# Patient Record
Sex: Male | Born: 1995 | Race: Black or African American | Hispanic: No | Marital: Single | Smoking: Never smoker
Health system: Southern US, Community
[De-identification: ages and names within clinical notes are randomized; demographics above are authoritative.]

---
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
## 2016-07-22 ENCOUNTER — Emergency Department (HOSPITAL_COMMUNITY)
Admission: EM | Admit: 2016-07-22 | Discharge: 2016-07-22 | Disposition: A | Discharge status: Home / Self Care | Payer: 59 | Attending: Emergency Medicine | Admitting: Emergency Medicine

## 2016-07-22 ENCOUNTER — Emergency Department (HOSPITAL_COMMUNITY): Payer: 59

## 2016-07-22 ENCOUNTER — Encounter (HOSPITAL_COMMUNITY): Payer: Self-pay | Admitting: Emergency Medicine

## 2016-07-22 DIAGNOSIS — R109 Unspecified abdominal pain: Secondary | ICD-10-CM | POA: Diagnosis present

## 2016-07-22 DIAGNOSIS — R1032 Left lower quadrant pain: Secondary | ICD-10-CM

## 2016-07-22 DIAGNOSIS — K5900 Constipation, unspecified: Secondary | ICD-10-CM

## 2016-07-22 DIAGNOSIS — J45909 Unspecified asthma, uncomplicated: Secondary | ICD-10-CM | POA: Insufficient documentation

## 2016-07-22 LAB — CBC
HEMATOCRIT: 39.5 % (ref 39.0–52.0)
Hemoglobin: 13.5 g/dL (ref 13.0–17.0)
MCH: 28.8 pg (ref 26.0–34.0)
MCHC: 34.2 g/dL (ref 30.0–36.0)
MCV: 84.2 fL (ref 78.0–100.0)
Platelets: 194 10*3/uL (ref 150–400)
RBC: 4.69 MIL/uL (ref 4.22–5.81)
RDW: 13.3 % (ref 11.5–15.5)
WBC: 10.2 10*3/uL (ref 4.0–10.5)

## 2016-07-22 LAB — COMPREHENSIVE METABOLIC PANEL
ALK PHOS: 87 U/L (ref 38–126)
ALT: 18 U/L (ref 17–63)
ANION GAP: 10 (ref 5–15)
AST: 25 U/L (ref 15–41)
Albumin: 4.9 g/dL (ref 3.5–5.0)
BILIRUBIN TOTAL: 0.6 mg/dL (ref 0.3–1.2)
BUN: 7 mg/dL (ref 6–20)
CALCIUM: 10.1 mg/dL (ref 8.9–10.3)
CO2: 25 mmol/L (ref 22–32)
Chloride: 105 mmol/L (ref 101–111)
Creatinine, Ser: 0.9 mg/dL (ref 0.61–1.24)
GFR calc Af Amer: >60 mL/min (ref >60)
Glucose, Bld: 131 mg/dL — ABNORMAL HIGH (ref 65–99)
Potassium: 3.2 mmol/L — ABNORMAL LOW (ref 3.5–5.1)
Sodium: 140 mmol/L (ref 135–145)
TOTAL PROTEIN: 8.1 g/dL (ref 6.5–8.1)

## 2016-07-22 LAB — URINALYSIS, ROUTINE W REFLEX MICROSCOPIC
BILIRUBIN URINE: NEGATIVE
Glucose, UA: NEGATIVE mg/dL
Hgb urine dipstick: NEGATIVE
KETONES UR: NEGATIVE mg/dL
LEUKOCYTES UA: NEGATIVE
NITRITE: NEGATIVE
PROTEIN: NEGATIVE mg/dL
Specific Gravity, Urine: 1.014 (ref 1.005–1.030)
pH: 9 — ABNORMAL HIGH (ref 5.0–8.0)

## 2016-07-22 LAB — LIPASE, BLOOD: Lipase: 44 U/L (ref 11–51)

## 2016-07-22 MED ORDER — SODIUM CHLORIDE 0.9 % IV BOLUS (SEPSIS)
1000.0000 mL | Freq: Once | INTRAVENOUS | Status: AC
  Administered 2016-07-22: 1000 mL via INTRAVENOUS

## 2016-07-22 MED ORDER — ONDANSETRON 8 MG PO TBDP: 8.0000 mg | ORAL_TABLET | Freq: Three times a day (TID) | ORAL | 0 refills | Status: DC | PRN

## 2016-07-22 MED ORDER — ONDANSETRON HCL 4 MG/2ML IJ SOLN
4.0000 mg | Freq: Once | INTRAMUSCULAR | Status: AC
  Administered 2016-07-22: 4 mg via INTRAVENOUS
  Filled 2016-07-22: qty 2

## 2016-07-22 MED ORDER — MAGNESIUM CITRATE PO SOLN
1.0000 | Freq: Once | ORAL | Status: AC
  Administered 2016-07-22: 1 via ORAL
  Filled 2016-07-22: qty 296

## 2016-07-22 MED ORDER — PROMETHAZINE HCL 25 MG/ML IJ SOLN
12.5000 mg | Freq: Once | INTRAMUSCULAR | Status: DC
  Filled 2016-07-22: qty 1

## 2016-07-22 MED ORDER — MORPHINE SULFATE (PF) 4 MG/ML IV SOLN
4.0000 mg | Freq: Once | INTRAVENOUS | Status: AC
  Administered 2016-07-22: 4 mg via INTRAVENOUS
  Filled 2016-07-22: qty 1

## 2016-07-22 NOTE — ED Provider Notes (Signed)
[facility]-EMERGENCY DEPT Provider Note   CSN: [id number] Arrival date & time: 07/22/16  0548     History   Chief Complaint Chief Complaint  Patient presents with  . Abdominal Pain    HPI Mark SettersChristopher Lamb is a 21 y.o. male.  HPI Mark SettersChristopher Danker is a 21 y.o. male presents to emergency department with sudden onset of severe left-sided abdominal pain. Pain onset several hours ago. Pain is sharp, shooting in the left lower quadrant. Reports between the sharp episodes, having dull pain. Reports associated nausea or vomiting. No diarrhea. No urinary symptoms. No fever or chills. No blood in his stool or emesis. No history of similar pain in the past. No treatment prior to coming in. Nothing is making his symptoms better or worse.  Past Medical History:  Diagnosis Date  . Asthma     There are no active problems to display for this patient.   Past Surgical History:  Procedure Laterality Date  . PATELLAR TENDON REPAIR  05/13/2011   Procedure: PATELLA TENDON REPAIR;  Surgeon: Loanne DrillingFrank V Aluisio;  Location: [facility];  Service: Orthopedics;  Laterality: Left;  LEFT TIBIAL TUBICAL FIXATION       Home Medications    Prior to Admission medications   Medication Sig Start Date End Date Taking? Authorizing Provider  ibuprofen (ADVIL,MOTRIN) 600 MG tablet Take 1 tablet (600 mg total) by mouth every 6 (six) hours as needed for pain. Patient not taking: Reported on 07/22/2016 04/08/12   Fayrene HelperBowie Tran, PA-C    Family History History reviewed. No pertinent family history.  Social History Social History  Substance Use Topics  . Smoking status: Never Smoker  . Smokeless tobacco: Never Used  . Alcohol use No     Allergies   Patient has no known allergies.   Review of Systems Review of Systems  Constitutional: Negative for chills and fever.  Respiratory: Negative for cough, chest tightness and shortness of breath.   Cardiovascular: Negative for chest pain, palpitations and leg swelling.    Gastrointestinal: Positive for abdominal pain, nausea and vomiting. Negative for abdominal distention and diarrhea.  Genitourinary: Negative for dysuria, frequency, hematuria and urgency.  Musculoskeletal: Negative for arthralgias, myalgias, neck pain and neck stiffness.  Skin: Negative for rash.  Allergic/Immunologic: Negative for immunocompromised state.  Neurological: Negative for dizziness, weakness, light-headedness, numbness and headaches.  All other systems reviewed and are negative.    Physical Exam Updated Vital Signs BP 123/78 (BP Location: Right Arm)   Pulse 82   Temp 97.3 F (36.3 C) (Oral)   Resp 20   Ht 6\' 1"  (1.854 m)   Wt 64.4 kg   SpO2 100%   BMI 18.73 kg/m   Physical Exam  Constitutional: He appears well-developed and well-nourished. No distress.  HENT:  Head: Normocephalic and atraumatic.  Eyes: Conjunctivae are normal.  Neck: Neck supple.  Cardiovascular: Normal rate, regular rhythm and normal heart sounds.   Pulmonary/Chest: Effort normal. No respiratory distress. He has no wheezes. He has no rales.  Abdominal: Soft. Bowel sounds are normal. He exhibits no distension. There is tenderness. There is no rebound.  LLQ tenderness. No CVA tenderness  Musculoskeletal: He exhibits no edema.  Neurological: He is alert.  Skin: Skin is warm and dry.  Nursing note and vitals reviewed.    ED Treatments / Results  Labs (all labs ordered are listed, but only abnormal results are displayed) Labs Reviewed  COMPREHENSIVE METABOLIC PANEL - Abnormal; Notable for the following:  Result Value   Potassium 3.2 (*)    Glucose, Bld 131 (*)    All other components within normal limits  URINALYSIS, ROUTINE W REFLEX MICROSCOPIC - Abnormal; Notable for the following:    APPearance CLOUDY (*)    pH 9.0 (*)    All other components within normal limits  LIPASE, BLOOD  CBC    EKG  EKG Interpretation None       Radiology Ct Renal Stone Study  Result Date:  07/22/2016 CLINICAL DATA:  Left-sided pain EXAM: CT ABDOMEN AND PELVIS WITHOUT CONTRAST TECHNIQUE: Multidetector CT imaging of the abdomen and pelvis was performed following the standard protocol without IV contrast. COMPARISON:  None. FINDINGS: Lower chest: Negative Hepatobiliary: Negative Pancreas: Negative Spleen: Negative Adrenals/Urinary Tract: Negative for renal calculi. No renal obstruction or mass. Mild thickening of the bladder wall could represent cystitis Stomach/Bowel: Normal stomach. No bowel obstruction. Moderate stool throughout the colon. Normal appendix. Vascular/Lymphatic: Negative Reproductive: Negative Other: No free fluid.  Negative for hernia. Musculoskeletal: Negative IMPRESSION: Moderate constipation Mild bladder wall thickening Negative for renal calculi Electronically Signed   By: Marlan Palau M.D.   On: 07/22/2016 08:44    Procedures Procedures (including critical care time)  Medications Ordered in ED Medications  ondansetron (ZOFRAN) injection 4 mg (4 mg Intravenous Given 07/22/16 0733)  morphine 4 MG/ML injection 4 mg (4 mg Intravenous Given 07/22/16 0733)  sodium chloride 0.9 % bolus 1,000 mL (0 mLs Intravenous Stopped 07/22/16 0825)  magnesium citrate solution 1 Bottle (1 Bottle Oral Given 07/22/16 0934)     Initial Impression / Assessment and Plan / ED Course  I have reviewed the triage vital signs and the nursing notes.  Pertinent labs & imaging results that were available during my care of the patient were reviewed by me and considered in my medical decision making (see chart for details).    Pt's symptoms of dull left sided pain with intermittent sharp pain, concerning for a kidney stone. Pt is otherwise healthy with no medical or prior abdominal issues. Labs pending. Will get ct non contrast for further evaluation.   UA cloudy but no signs of infection. Labs showing potassium of 3.2, glu of 131, WBC normal. Renal stone study obtained and is negative. Pt has  some bladder wall thickening but no signs of UTI. Normal appendix. Moderate constipation on CT, will treat with miralax and mag citrate. Patient feels better at this time. He is in no distress. He is laughing, no more vomiting. Home with follow up as needed. Return precautions discussed.   Vitals:   07/22/16 0559 07/22/16 0640 07/22/16 0700 07/22/16 0730  BP: 123/78  114/67 121/68  Pulse: 82  76 78  Resp: 20  23 10   Temp: 97.3 F (36.3 C)     TempSrc: Oral     SpO2: 100%  93% 99%  Weight:  64.4 kg    Height:  6\' 1"  (1.854 m)       Final Clinical Impressions(s) / ED Diagnoses   Final diagnoses:  Left lower quadrant pain  Constipation, unspecified constipation type    New Prescriptions Discharge Medication List as of 07/22/2016  9:21 AM    START taking these medications   Details  ondansetron (ZOFRAN ODT) 8 MG disintegrating tablet Take 1 tablet (8 mg total) by mouth every 8 (eight) hours as needed for nausea or vomiting., Starting Thu 07/22/2016, Print         [employer], PA-C 07/22/16 1453  Jacalyn Lefevre, MD 07/29/16 1330

## 2016-07-22 NOTE — ED Triage Notes (Signed)
Pt states he woke with pain in his left lower quadrant around 4am  Pt states he has had nausea and vomiting ever since  Pt states last bowel movement was yesterday

## 2016-07-22 NOTE — ED Notes (Signed)
Pt has active vomiting in triage

## 2016-07-22 NOTE — Discharge Instructions (Signed)
Take magnesium citrate as prescribed until all gone. You can try miralax daily if continue to have constipation problems. Drink plenty of fluids. Take Zofran as prescribed as needed for any nausea. Tylenol for pain. Please follow-up with the family doctor or return if not improving.

## 2016-11-16 ENCOUNTER — Other Ambulatory Visit: Payer: Self-pay | Admitting: *Deleted

## 2016-11-16 ENCOUNTER — Encounter: Payer: Self-pay | Admitting: Neurology

## 2016-11-16 DIAGNOSIS — R202 Paresthesia of skin: Secondary | ICD-10-CM

## 2016-11-30 ENCOUNTER — Encounter: Payer: 59 | Admitting: Neurology

## 2017-03-31 ENCOUNTER — Emergency Department (HOSPITAL_COMMUNITY)
Admission: EM | Admit: 2017-03-31 | Discharge: 2017-03-31 | Disposition: A | Discharge status: Home / Self Care | Payer: 59 | Attending: Emergency Medicine | Admitting: Emergency Medicine

## 2017-03-31 ENCOUNTER — Emergency Department (HOSPITAL_COMMUNITY): Payer: 59

## 2017-03-31 ENCOUNTER — Encounter (HOSPITAL_COMMUNITY): Payer: Self-pay | Admitting: Emergency Medicine

## 2017-03-31 DIAGNOSIS — Y929 Unspecified place or not applicable: Secondary | ICD-10-CM | POA: Diagnosis not present

## 2017-03-31 DIAGNOSIS — S99921A Unspecified injury of right foot, initial encounter: Secondary | ICD-10-CM | POA: Diagnosis present

## 2017-03-31 DIAGNOSIS — Y9389 Activity, other specified: Secondary | ICD-10-CM | POA: Insufficient documentation

## 2017-03-31 DIAGNOSIS — S92426A Nondisplaced fracture of distal phalanx of unspecified great toe, initial encounter for closed fracture: Secondary | ICD-10-CM | POA: Diagnosis not present

## 2017-03-31 DIAGNOSIS — W230XXA Caught, crushed, jammed, or pinched between moving objects, initial encounter: Secondary | ICD-10-CM | POA: Diagnosis not present

## 2017-03-31 DIAGNOSIS — J45909 Unspecified asthma, uncomplicated: Secondary | ICD-10-CM | POA: Diagnosis not present

## 2017-03-31 DIAGNOSIS — Y998 Other external cause status: Secondary | ICD-10-CM | POA: Diagnosis not present

## 2017-03-31 MED ORDER — HYDROCODONE-ACETAMINOPHEN 5-325 MG PO TABS
2.0000 | ORAL_TABLET | Freq: Once | ORAL | Status: AC
Start: 2017-03-31 — End: 2017-03-31
  Administered 2017-03-31: 2 via ORAL
  Filled 2017-03-31: qty 2

## 2017-03-31 MED ORDER — IBUPROFEN 800 MG PO TABS: 800.0000 mg | ORAL_TABLET | Freq: Three times a day (TID) | ORAL | 0 refills | Status: DC

## 2017-03-31 NOTE — ED Triage Notes (Signed)
Pt c/o right great toe pain after dropping a dresser on his foot tonight.

## 2017-03-31 NOTE — ED Provider Notes (Signed)
[facility] Provider Note   CSN: [id number] Arrival date & time: 03/31/17  0147     History   Chief Complaint Chief Complaint  Patient presents with  . Toe Pain    HPI Mark Lamb is a 21 y.o. male.  Patient presents to the emergency department with a chief complaint of right great toe pain.  He states that he was helping a friend move chest of drawers, and dropped the dresser on his foot.  He reports pain at the site.  He took Advil and went to bed.  States that when he got up to use the bathroom, he noticed that it was swollen and more painful.  He reports increased pain with movement and palpation.  He denies any other associated symptoms.   The history is provided by the patient. No language interpreter was used.    Past Medical History:  Diagnosis Date  . Asthma     There are no active problems to display for this patient.   Past Surgical History:  Procedure Laterality Date  . PATELLAR TENDON REPAIR  05/13/2011   Procedure: PATELLA TENDON REPAIR;  Surgeon: Loanne DrillingFrank V Aluisio;  Location: [facility];  Service: Orthopedics;  Laterality: Left;  LEFT TIBIAL TUBICAL FIXATION       Home Medications    Prior to Admission medications   Medication Sig Start Date End Date Taking? Authorizing Provider  ibuprofen (ADVIL,MOTRIN) 600 MG tablet Take 1 tablet (600 mg total) by mouth every 6 (six) hours as needed for pain. Patient not taking: Reported on 07/22/2016 04/08/12   Fayrene Helperran, Bowie, PA-C  ondansetron (ZOFRAN ODT) 8 MG disintegrating tablet Take 1 tablet (8 mg total) by mouth every 8 (eight) hours as needed for nausea or vomiting. 07/22/16   Jaynie CrumbleKirichenko, Tatyana, PA-C    Family History No family history on file.  Social History Social History  Substance Use Topics  . Smoking status: Never Smoker  . Smokeless tobacco: Never Used  . Alcohol use No     Allergies   Patient has no known allergies.   Review of Systems Review of  Systems  All other systems reviewed and are negative.    Physical Exam Updated Vital Signs BP (!) 129/92 (BP Location: Left Arm)   Pulse 72   Temp 98.3 F (36.8 C) (Oral)   Resp 14   Ht 6\' 1"  (1.854 m)   Wt 59.4 kg (131 lb)   SpO2 98%   BMI 17.28 kg/m   Physical Exam Nursing note and vitals reviewed.  Constitutional: Pt appears well-developed and well-nourished. No distress.  HENT:  Head: Normocephalic and atraumatic.  Eyes: Conjunctivae are normal.  Neck: Normal range of motion.  Cardiovascular: Normal rate, regular rhythm. Intact distal pulses.   Capillary refill < 3 sec.  Pulmonary/Chest: Effort normal and breath sounds normal.  Musculoskeletal:  Right great toe Pt exhibits TTP of the right great toe.   ROM: 5/5  Strength: 5/5  Neurological: Pt  is alert. Coordination normal.  Sensation: 5/5 Skin: Skin is warm and dry. Pt is not diaphoretic.  No evidence of open wound or skin tenting Psychiatric: Pt has a normal mood and affect.     ED Treatments / Results  Labs (all labs ordered are listed, but only abnormal results are displayed) Labs Reviewed - No data to display  EKG  EKG Interpretation None       Radiology Dg Foot Complete Right  Result Date: 03/31/2017 CLINICAL DATA:  Acute onset of right great toe and second toe pain after dropping dresser on foot. Initial encounter. EXAM: RIGHT FOOT COMPLETE - 3+ VIEW COMPARISON:  Right ankle radiographs performed 04/08/2012 FINDINGS: There is a minimally displaced fracture at the lateral aspect of the base of the first distal phalanx, with intra-articular extension and associated soft tissue swelling. There is no evidence of talar subluxation; the subtalar joint is unremarkable in appearance. No significant soft tissue abnormalities are seen. IMPRESSION: Minimally displaced fracture at the lateral aspect of the base of the first distal phalanx. Electronically Signed   By: Roanna Raider M.D.   On: 03/31/2017 02:49     Procedures Procedures (including critical care time)  Medications Ordered in ED Medications  HYDROcodone-acetaminophen (NORCO/VICODIN) 5-325 MG per tablet 2 tablet (not administered)     Initial Impression / Assessment and Plan / ED Course  I have reviewed the triage vital signs and the nursing notes.  Pertinent labs & imaging results that were available during my care of the patient were reviewed by me and considered in my medical decision making (see chart for details).     Patient X-Ray negative for obvious fracture or dislocation.  Pt advised to follow up with orthopedics. Patient given post op shoe while in ED, conservative therapy recommended and discussed. Patient will be discharged home & is agreeable with above plan. Returns precautions discussed. Pt appears safe for discharge.   Final Clinical Impressions(s) / ED Diagnoses   Final diagnoses:  Closed nondisplaced fracture of distal phalanx of great toe, unspecified laterality, initial encounter    New Prescriptions New Prescriptions   IBUPROFEN (ADVIL,MOTRIN) 800 MG TABLET    Take 1 tablet (800 mg total) by mouth 3 (three) times daily.     Roxy Horseman, PA-C 03/31/17 0328    Ward, Layla Maw, DO 03/31/17 [phone]

## 2017-09-14 ENCOUNTER — Emergency Department (HOSPITAL_COMMUNITY): Payer: 59

## 2017-09-14 ENCOUNTER — Emergency Department (HOSPITAL_COMMUNITY)
Admission: EM | Admit: 2017-09-14 | Discharge: 2017-09-14 | Disposition: A | Discharge status: Home / Self Care | Payer: 59 | Attending: Emergency Medicine | Admitting: Emergency Medicine

## 2017-09-14 ENCOUNTER — Encounter (HOSPITAL_COMMUNITY): Payer: Self-pay

## 2017-09-14 DIAGNOSIS — Y9301 Activity, walking, marching and hiking: Secondary | ICD-10-CM | POA: Insufficient documentation

## 2017-09-14 DIAGNOSIS — S92025A Nondisplaced fracture of anterior process of left calcaneus, initial encounter for closed fracture: Secondary | ICD-10-CM | POA: Insufficient documentation

## 2017-09-14 DIAGNOSIS — J45909 Unspecified asthma, uncomplicated: Secondary | ICD-10-CM | POA: Diagnosis not present

## 2017-09-14 DIAGNOSIS — Y929 Unspecified place or not applicable: Secondary | ICD-10-CM | POA: Insufficient documentation

## 2017-09-14 DIAGNOSIS — S99922A Unspecified injury of left foot, initial encounter: Secondary | ICD-10-CM | POA: Diagnosis present

## 2017-09-14 DIAGNOSIS — Y999 Unspecified external cause status: Secondary | ICD-10-CM | POA: Insufficient documentation

## 2017-09-14 DIAGNOSIS — W0110XA Fall on same level from slipping, tripping and stumbling with subsequent striking against unspecified object, initial encounter: Secondary | ICD-10-CM | POA: Diagnosis not present

## 2017-09-14 MED ORDER — HYDROCODONE-ACETAMINOPHEN 5-325 MG PO TABS
2.0000 | ORAL_TABLET | Freq: Once | ORAL | Status: AC
  Administered 2017-09-14: 2 via ORAL
  Filled 2017-09-14: qty 2

## 2017-09-14 MED ORDER — HYDROCODONE-ACETAMINOPHEN 5-325 MG PO TABS: 1.0000 | ORAL_TABLET | Freq: Four times a day (QID) | ORAL | 0 refills | Status: DC | PRN

## 2017-09-14 NOTE — ED Triage Notes (Signed)
Pt was running and fell into a hole, he complains of left foot pain, the top of his foot is swollen

## 2017-09-14 NOTE — Discharge Instructions (Signed)
Your x-ray today is concerning for a calcaneal fracture.  He has been placed in a splint which you should wear at all times.  Do not remove this.  Do not put any weight on your left foot.  You have been given crutches to assist you when you are mobile.  Follow-up with orthopedics to ensure proper healing of your broken bone.  You may take Norco as prescribed for pain control.  Ice over top of your splint 3-4 times per day for 15-20 minutes each time.  Also keep your foot elevated as much as possible.  You may return to the emergency department for new or concerning symptoms such as severe worsening of your pain, paleness to your toes, inability to feel your toes.

## 2017-09-14 NOTE — ED Provider Notes (Signed)
[facility]-EMERGENCY DEPT Provider Note   CSN: [id number] Arrival date & time: 09/14/17  0201     History   Chief Complaint Chief Complaint  Patient presents with  . Foot Pain    HPI [name] is a 22 y.o. male.   22 year old male presents to the emergency department for evaluation of left foot pain which has been gradually worsening since accidentally stepping into a hole tonight.  He took 2 Aleve prior to arrival without improvement in his pain.  Pain is aggravated with weightbearing.  He did notice some temporary improvement with a warm Epsom salt soaks.  He has continued to experience swelling to the lateral aspect of his foot.  He feels paresthesias to his distal lower extremity.  No numbness to toes or associated pallor.  No prior injury to the left foot.     Past Medical History:  Diagnosis Date  . Asthma     There are no active problems to display for this patient.   Past Surgical History:  Procedure Laterality Date  . PATELLAR TENDON REPAIR  05/13/2011   Procedure: PATELLA TENDON REPAIR;  Surgeon: Loanne DrillingFrank V Aluisio;  Location: [facility];  Service: Orthopedics;  Laterality: Left;  LEFT TIBIAL TUBICAL FIXATION        Home Medications    Prior to Admission medications   Medication Sig Start Date End Date Taking? Authorizing Provider  naproxen sodium (ALEVE) 220 MG tablet Take 440 mg by mouth 2 (two) times daily as needed (pain).   Yes [provider]  HYDROcodone-acetaminophen (NORCO/VICODIN) 5-325 MG tablet Take 1-2 tablets by mouth every 6 (six) hours as needed for moderate pain or severe pain. 09/14/17   Antony MaduraHumes, Araya Roel, PA-C  ibuprofen (ADVIL,MOTRIN) 800 MG tablet Take 1 tablet (800 mg total) by mouth 3 (three) times daily. Patient not taking: Reported on 09/14/2017 03/31/17   Roxy HorsemanBrowning, Robert, PA-C  ondansetron (ZOFRAN ODT) 8 MG disintegrating tablet Take 1 tablet (8 mg total) by mouth every 8 (eight) hours as needed for nausea  or vomiting. Patient not taking: Reported on 09/14/2017 07/22/16   Jaynie CrumbleKirichenko, Tatyana, PA-C    Family History History reviewed. No pertinent family history.  Social History Social History   Tobacco Use  . Smoking status: Never Smoker  . Smokeless tobacco: Never Used  Substance Use Topics  . Alcohol use: No  . Drug use: No     Allergies   Patient has no known allergies.   Review of Systems Review of Systems Ten systems reviewed and are negative for acute change, except as noted in the HPI.    Physical Exam Updated Vital Signs There were no vitals taken for this visit.  Physical Exam  Constitutional: He is oriented to person, place, and time. He appears well-developed and well-nourished. No distress.  Nontoxic appearing and in no distress  HENT:  Head: Normocephalic and atraumatic.  Eyes: Conjunctivae and EOM are normal. No scleral icterus.  Neck: Normal range of motion.  Cardiovascular: Normal rate, regular rhythm and intact distal pulses.  DP pulse and PT pulse 2+ in the LLE. Capillary refill brisk in all digits of the L foot.  Pulmonary/Chest: Effort normal. No respiratory distress.  Respirations even and unlabored  Musculoskeletal:       Left foot: There is decreased range of motion (2/2 pain), tenderness and swelling. There is no deformity.       Feet:  Neurological: He is alert and oriented to person, place, and time. He exhibits  normal muscle tone. Coordination normal.  Sensation to light touch intact.  Patient able to wiggle all toes.  Skin: Skin is warm and dry. No rash noted. He is not diaphoretic. No erythema. No pallor.  Psychiatric: He has a normal mood and affect. His behavior is normal.  Nursing note and vitals reviewed.    ED Treatments / Results  Labs (all labs ordered are listed, but only abnormal results are displayed) Labs Reviewed - No data to display  EKG None  Radiology Dg Foot Complete Left  Result Date: 09/14/2017 CLINICAL DATA:   Left foot pain after fall after stepping in a hole. EXAM: LEFT FOOT - COMPLETE 3+ VIEW COMPARISON:  None. FINDINGS: Cortical irregularity of the anterolateral calcaneus suspicious for acute fracture. No additional fracture of the foot. The alignment and joint spaces are maintained. Slight lateral soft tissue edema. IMPRESSION: Cortical irregularity of the anterolateral calcaneus suspicious for acute fracture. Electronically Signed   By: Rubye Oaks M.D.   On: 09/14/2017 03:53    Procedures Procedures (including critical care time)  Medications Ordered in ED Medications  HYDROcodone-acetaminophen (NORCO/VICODIN) 5-325 MG per tablet 2 tablet (2 tablets Oral Given 09/14/17 0426)     Initial Impression / Assessment and Plan / ED Course  I have reviewed the triage vital signs and the nursing notes.  Pertinent labs & imaging results that were available during my care of the patient were reviewed by me and considered in my medical decision making (see chart for details).     22 year old male presenting to the emergency department for evaluation of left foot pain after falling in a hole.  He has concern for possible anterior calcaneus fracture.  Patient neurovascularly intact without signs of compartment syndrome.  He has been given Norco for pain management and placed in a bulky Jones splint for stability.  Crutches given with strict instructions for patient to remain nonweightbearing.  He has been given a referral to orthopedics for outpatient follow-up.  Return precautions discussed and provided. Patient discharged in stable condition with no unaddressed concerns.   Final Clinical Impressions(s) / ED Diagnoses   Final diagnoses:  Closed nondisplaced fracture of anterior process of left calcaneus, initial encounter    ED Discharge Orders        Ordered    HYDROcodone-acetaminophen (NORCO/VICODIN) 5-325 MG tablet  Every 6 hours PRN     09/14/17 0508       Antony Madura, PA-C 09/14/17  0522    Molpus, Jonny Ruiz, MD 09/14/17 [phone]

## 2017-09-22 ENCOUNTER — Encounter (INDEPENDENT_AMBULATORY_CARE_PROVIDER_SITE_OTHER): Payer: Self-pay | Admitting: Surgery

## 2017-09-22 ENCOUNTER — Ambulatory Visit (INDEPENDENT_AMBULATORY_CARE_PROVIDER_SITE_OTHER): Payer: 59 | Admitting: Surgery

## 2017-09-22 VITALS — Ht 73.0 in | Wt 131.0 lb

## 2017-09-22 DIAGNOSIS — S92002A Unspecified fracture of left calcaneus, initial encounter for closed fracture: Secondary | ICD-10-CM | POA: Diagnosis not present

## 2017-09-22 DIAGNOSIS — S93492A Sprain of other ligament of left ankle, initial encounter: Secondary | ICD-10-CM | POA: Diagnosis not present

## 2017-09-22 NOTE — Progress Notes (Signed)
Office Visit Note   Patient: Mark Lamb           Date of Birth: 07-Sep-1995           MRN: [id number] Visit Date: 09/22/2017              Requested by: No referring provider defined for this encounter. PCP: Patient, No Pcp Per   Assessment & Plan: Visit Diagnoses:  1. Closed nondisplaced fracture of left calcaneus, unspecified portion of calcaneus, initial encounter   2. Sprain of anterior talofibular ligament of left ankle, initial encounter     Plan: Today patient was put in a Cam boot.  We will continue nonweightbearing left foot until he sees Dr. Ophelia Charter in 1 week.  Ice and elevate as needed.  Recommend that he be out of work at least until follow-up visit.  Patient was wanting to return back to work but I advised him that this is going to be extremely difficult considering that he will likely be nonweightbearing for at least 6 weeks.  We will see if Dr. Ophelia Charter wants to change the plan next week when he is seen.  Follow-Up Instructions: Return in about 1 week (around 09/29/2017) for WITH DR YATES.   Orders:  No orders of the defined types were placed in this encounter.  No orders of the defined types were placed in this encounter.     Procedures: No procedures performed   Clinical Data: No additional findings.   Subjective: Chief Complaint  Patient presents with  . Right Foot - Follow-up, Pain    DOI 09/14/17 stepped in a hole - went to [facility] ED    HPI 22 year old black male who is new patient to the clinic comes in with left heel and lateral ankle pain.  States that September 14, 2017 he was running at full speed with his friends when his foot went into a hole and he suffered an inversion injury of the left ankle.  He went to the emergency room and had x-rays.  These showed:  EXAM: LEFT FOOT - COMPLETE 3+ VIEW  COMPARISON:  None.  FINDINGS: Cortical irregularity of the anterolateral calcaneus suspicious for acute fracture. No additional fracture of the foot.  The alignment and joint spaces are maintained. Slight lateral soft tissue edema.  IMPRESSION: Cortical irregularity of the anterolateral calcaneus suspicious for acute fracture.  Patient was put in a splint and has been nonweightbearing with crutches.   Review of Systems No current cardiac pulmonary GI GU issues  Objective: Vital Signs: Ht 6\' 1"  (1.854 m)   Wt 131 lb (59.4 kg)   BMI 17.28 kg/m   Physical Exam  Constitutional: He is oriented to person, place, and time. No distress.  HENT:  Head: Normocephalic and atraumatic.  Eyes: Pupils are equal, round, and reactive to light. EOM are normal.  Neck: Normal range of motion.  Pulmonary/Chest: No respiratory distress.  Musculoskeletal:  Left ankle good range of motion.  Ligaments stable.  He is mild to moderately tender over the ATFL.  He is tender over the lateral calcaneus over the fracture site.  Neuro vas intact.  Neurological: He is alert and oriented to person, place, and time.  Psychiatric: He has a normal mood and affect.    Ortho Exam  Specialty Comments:  No specialty comments available.  Imaging: No results found.   PMFS History: There are no active problems to display for this patient.  Past Medical History:  Diagnosis Date  . Asthma  History reviewed. No pertinent family history.  Past Surgical History:  Procedure Laterality Date  . PATELLAR TENDON REPAIR  05/13/2011   Procedure: PATELLA TENDON REPAIR;  Surgeon: Loanne DrillingFrank V Aluisio;  Location: [facility];  Service: Orthopedics;  Laterality: Left;  LEFT TIBIAL TUBICAL FIXATION   Social History   Occupational History  . Not on file  Tobacco Use  . Smoking status: Never Smoker  . Smokeless tobacco: Never Used  Substance and Sexual Activity  . Alcohol use: No  . Drug use: No  . Sexual activity: Never

## 2017-10-04 ENCOUNTER — Encounter (INDEPENDENT_AMBULATORY_CARE_PROVIDER_SITE_OTHER): Payer: Self-pay | Admitting: Orthopaedic Surgery

## 2017-10-04 ENCOUNTER — Ambulatory Visit (INDEPENDENT_AMBULATORY_CARE_PROVIDER_SITE_OTHER): Payer: 59 | Admitting: Orthopaedic Surgery

## 2017-10-04 ENCOUNTER — Ambulatory Visit (INDEPENDENT_AMBULATORY_CARE_PROVIDER_SITE_OTHER): Payer: 59

## 2017-10-04 VITALS — BP 129/64 | HR 77 | Ht 73.0 in | Wt 131.0 lb

## 2017-10-04 DIAGNOSIS — S92002A Unspecified fracture of left calcaneus, initial encounter for closed fracture: Secondary | ICD-10-CM

## 2017-10-04 NOTE — Progress Notes (Signed)
   Office Visit Note   Patient: Mark Lamb           Date of Birth: [birth year]           MRN: [id number] Visit Date: 10/04/2017              Requested by: No referring provider defined for this encounter. PCP: Patient, No Pcp Per   Assessment & Plan: Visit Diagnoses:  1. Closed nondisplaced fracture of left calcaneus, unspecified portion of calcaneus, initial encounter     Plan: patient ok to start weightbearing as tolerated in his boot. Follow up with Dr Ophelia Charter in 2 weeks for recheck.  No running or jumping.  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  Follow-Up Instructions: Return in about 2 weeks (around 10/18/2017) for with Dr Ophelia Charter .   Orders:  Orders Placed This Encounter  Procedures  . XR Foot Complete Left   No orders of the defined types were placed in this encounter.     Procedures: No procedures performed   Clinical Data: No additional findings.   Subjective: Chief Complaint  Patient presents with  . Left Foot - Fracture, Follow-up    HPI 22 year old black male returns for recheck.  He does still continue to have foot pain attempted weightbearing.  I had instructed him to be nonweightbearing last office visit.  Patient comes in today for recheck to see if he is better for Dr. Ophelia Charter to release him back to work.  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 Review of Systems No current cardiac pulmonary GI GU issues  Objective: Vital Signs: BP 129/64   Pulse 77   Ht  (1.854 m)   Wt 131 lb (59.4 kg)   BMI 17.28 kg/m   Physical Exam  Constitutional: He is oriented to person, place, and time. No distress.  HENT:  Head: Normocephalic and atraumatic.  Eyes: Pupils are equal, round, and reactive to light.    Pulmonary/Chest: No respiratory distress.  Musculoskeletal:  No foot swelling.  He still continues to be tender over the avulsion fracture site.  Neurovascular intact.  Seen with Dr. Ophelia Charter today.  Neurological: He is alert and oriented to person, place, and time.    Ortho Exam  Specialty Comments:  No specialty comments available.  Imaging: No results found.   PMFS History: There are no active problems to display for this patient.  Past Medical History:  Diagnosis Date  . Asthma     No family history on file.  Past Surgical History:  Procedure Laterality Date  . PATELLAR TENDON REPAIR  05/13/2011   Procedure: PATELLA TENDON REPAIR;  Surgeon: Loanne Drilling;  Location: [facility];  Service: Orthopedics;  Laterality: Left;  LEFT TIBIAL TUBICAL FIXATION   Social History   Occupational History  . Not on file  Tobacco Use  . Smoking status: Never Smoker  . Smokeless tobacco: Never Used  Substance and Sexual Activity  . Alcohol use: No  . Drug use: No  . Sexual activity: Never

## 2017-10-04 NOTE — Patient Instructions (Signed)
Okay to start weightbearing as tolerated in boot with crutches.  Elevate foot as much as possible to help decrease swelling and pain.  No running or jumping.

## 2017-10-19 ENCOUNTER — Ambulatory Visit (INDEPENDENT_AMBULATORY_CARE_PROVIDER_SITE_OTHER): Payer: 59 | Admitting: Orthopaedic Surgery

## 2017-10-19 ENCOUNTER — Encounter (INDEPENDENT_AMBULATORY_CARE_PROVIDER_SITE_OTHER): Payer: Self-pay | Admitting: Orthopaedic Surgery

## 2017-10-19 VITALS — BP 130/73 | HR 72

## 2017-10-19 DIAGNOSIS — S93602D Unspecified sprain of left foot, subsequent encounter: Secondary | ICD-10-CM

## 2017-10-19 NOTE — Progress Notes (Signed)
   Office Visit Note   Patient: Mark Lamb           Date of Birth: [birth year]           MRN: [id number] Visit Date: 10/19/2017              Requested by: No referring provider defined for this encounter. PCP: Patient, No Pcp Per   Assessment & Plan: Visit Diagnoses:  1. Foot sprain, left, subsequent encounter     Plan: Work slip given for work resumption tomorrow.  Office follow-up if he has persistent symptoms.  Follow-Up Instructions: No follow-ups on file.   Orders:  No orders of the defined types were placed in this encounter.  No orders of the defined types were placed in this encounter.     Procedures: No procedures performed   Clinical Data: No additional findings.   Subjective: Chief Complaint  Patient presents with  . Left Foot - Pain    HPI follow-up calcaneus chip fracture.  He denies any significant pain in his left foot unless he stands in one position for long period of time.  He is been using his cam boot and can walk in the office today without his cam boot.  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  Work slip given for work resumption for tomorrow.   Review of Systems unchanged from last office visit.   Objective: Vital Signs: BP 130/73   Pulse 72   Physical Exam  Constitutional: He is oriented to person, place, and time. He appears well-developed and well-nourished.  HENT:  Head: Normocephalic and atraumatic.  Eyes: Pupils are equal, round, and reactive to light. EOM are normal.  Neck: No tracheal deviation present. No thyromegaly present.  Cardiovascular: Normal rate.  Pulmonary/Chest: Effort normal. He has no wheezes.  Abdominal: Soft. Bowel sounds are normal.  Neurological: He is alert and oriented to person, place, and time.  Skin: Skin is warm and dry. Capillary refill takes less than 2 seconds.  Psychiatric: He has a normal mood and affect. His behavior is normal. Judgment and thought content normal.     Ortho Exam no significant swelling to left foot.  Normal heel toe gait.  Sensation is intact to the foot.  Normal knee and hip range of motion.  Specialty Comments:  No specialty comments available.  Imaging: No results found.   PMFS History: There are no active problems to display for this patient.  Past Medical History:  Diagnosis Date  . Asthma     History reviewed. No pertinent family history.  Past Surgical History:  Procedure Laterality Date  . PATELLAR TENDON REPAIR  05/13/2011   Procedure: PATELLA TENDON REPAIR;  Surgeon: Loanne Drilling;  Location: [facility];  Service: Orthopedics;  Laterality: Left;  LEFT TIBIAL TUBICAL FIXATION   Social History   Occupational History  . Not on file  Tobacco Use  . Smoking status: Never Smoker  . Smokeless tobacco: Never Used  Substance and Sexual Activity  . Alcohol use: No  . Drug use: No  . Sexual activity: Never

## 2019-01-27 IMAGING — CR DG FOOT COMPLETE 3+V*L*
3 series · 3 of 3 positions shown · non-contrast
Comparison: None.

CLINICAL DATA: Left foot pain after fall after stepping in a hole.

EXAM:
LEFT FOOT - COMPLETE 3+ VIEW

[x foot ap left]
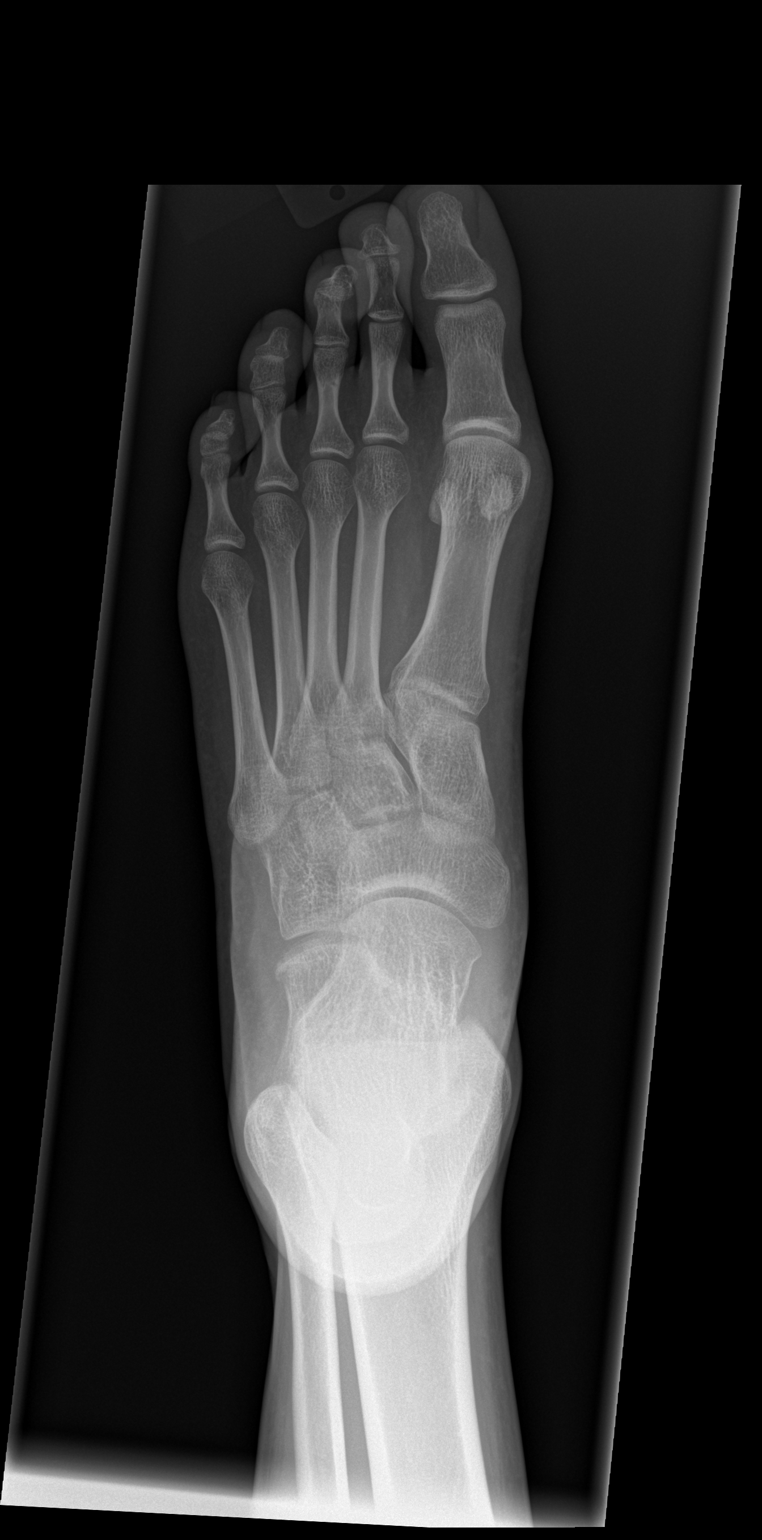

[x foot obl left]
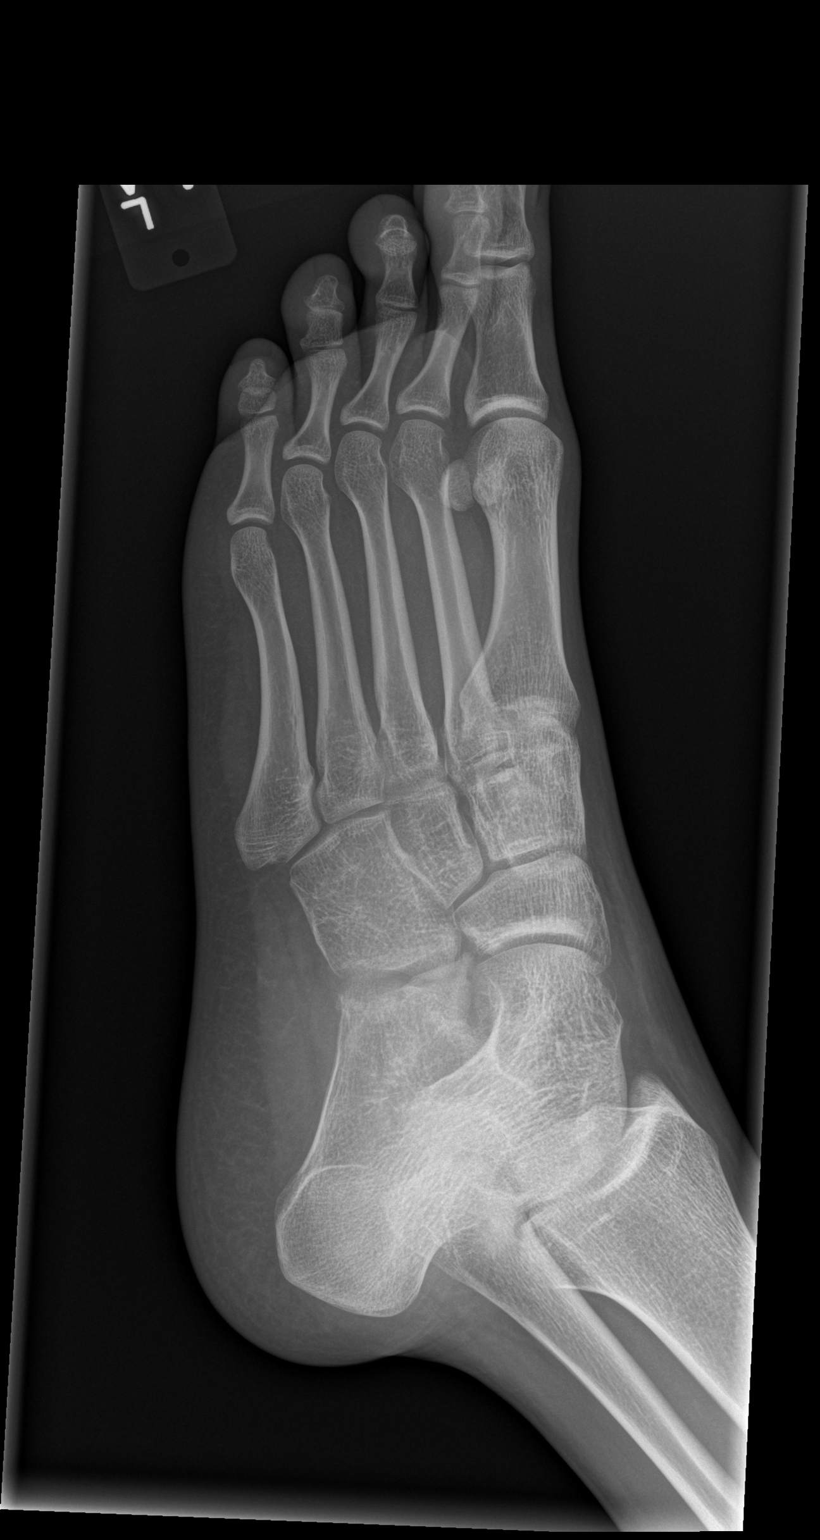

[x foot lat left]
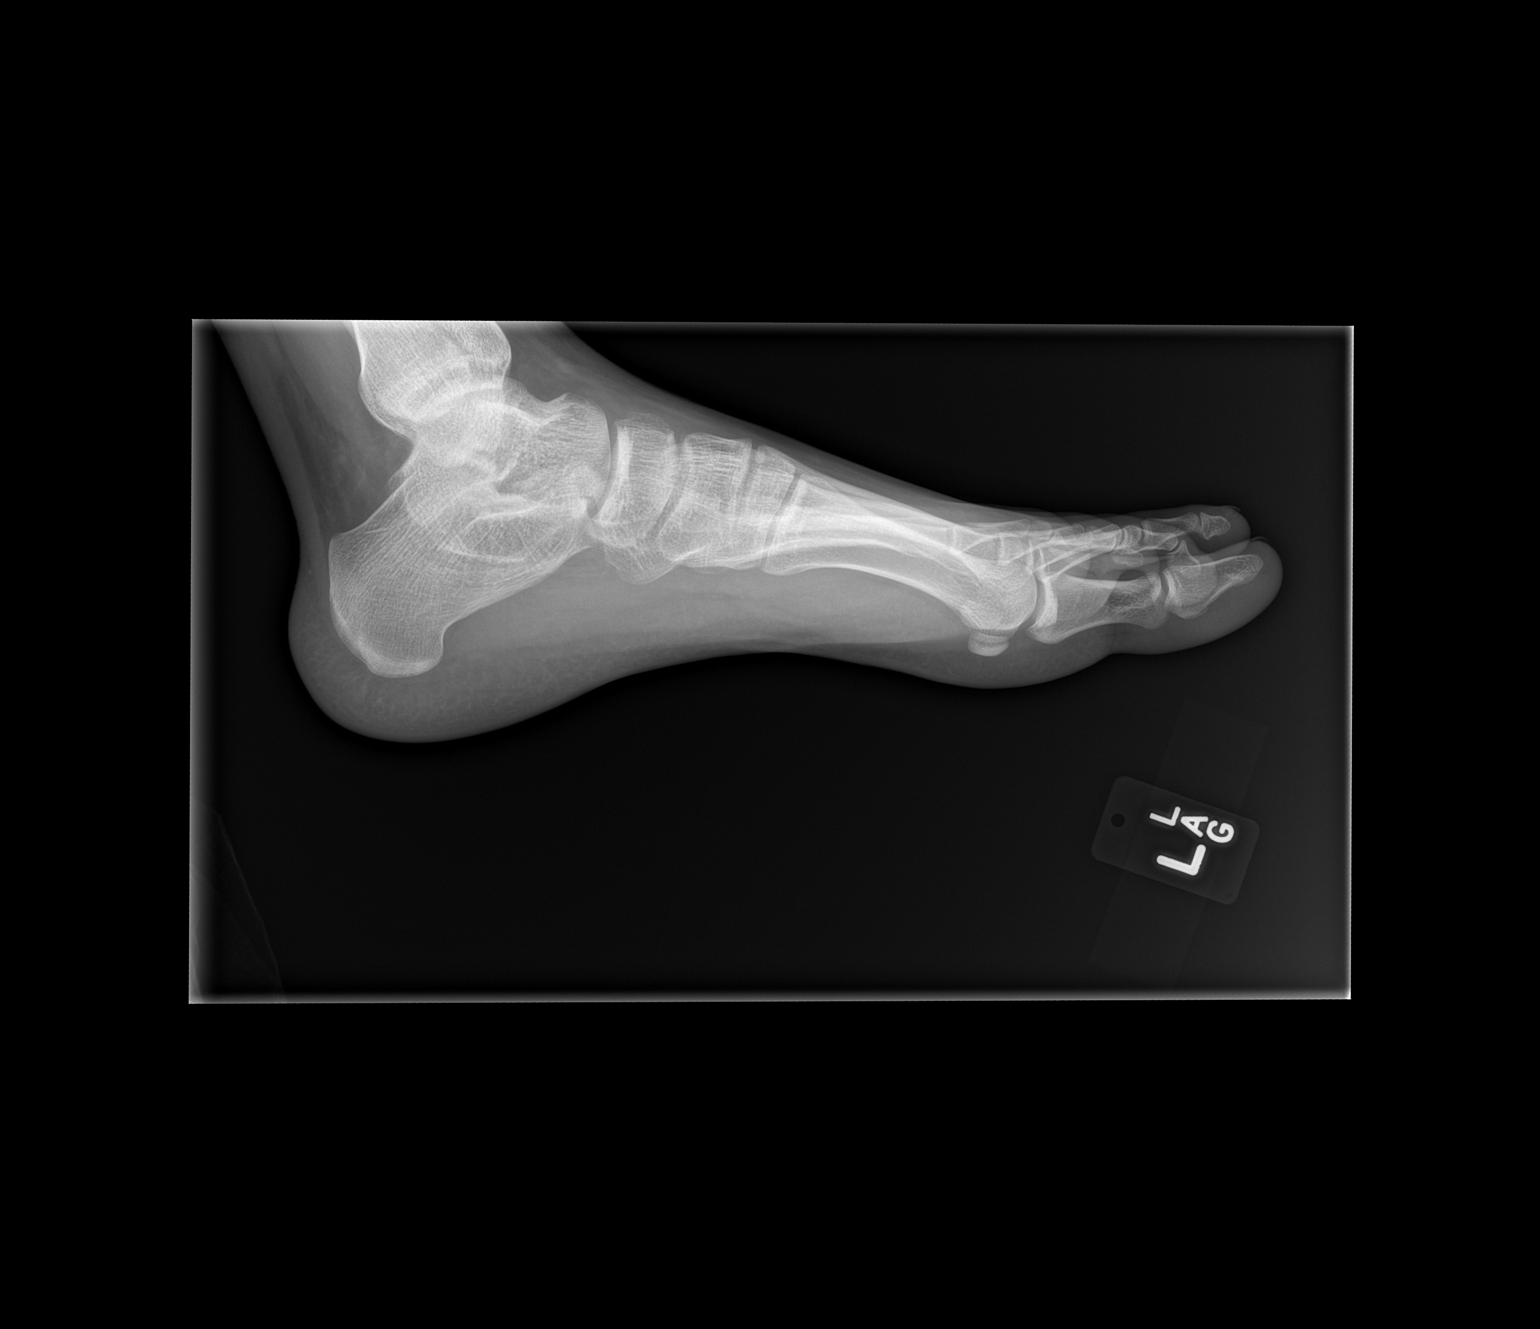

[3 of 3 positions shown; findings below may reference images not displayed]

FINDINGS: Cortical irregularity of the anterolateral calcaneus suspicious for
acute fracture. No additional fracture of the foot. The alignment
and joint spaces are maintained. Slight lateral soft tissue edema.
IMPRESSION: Cortical irregularity of the anterolateral calcaneus suspicious for
acute fracture.

## 2021-06-23 ENCOUNTER — Other Ambulatory Visit: Payer: Self-pay

## 2021-06-23 ENCOUNTER — Ambulatory Visit (HOSPITAL_COMMUNITY)
Admission: EM | Admit: 2021-06-23 | Discharge: 2021-06-23 | Disposition: A | Discharge status: Home / Self Care | Payer: 59 | Attending: Family Medicine | Admitting: Family Medicine

## 2021-06-23 ENCOUNTER — Encounter (HOSPITAL_COMMUNITY): Payer: Self-pay | Admitting: *Deleted

## 2021-06-23 DIAGNOSIS — S61210A Laceration without foreign body of right index finger without damage to nail, initial encounter: Secondary | ICD-10-CM

## 2021-06-23 MED ORDER — TETANUS-DIPHTH-ACELL PERTUSSIS 5-2.5-18.5 LF-MCG/0.5 IM SUSY
0.5000 mL | PREFILLED_SYRINGE | Freq: Once | INTRAMUSCULAR | Status: AC
  Administered 2021-06-23: 0.5 mL via INTRAMUSCULAR

## 2021-06-23 MED ORDER — TETANUS-DIPHTH-ACELL PERTUSSIS 5-2.5-18.5 LF-MCG/0.5 IM SUSY
PREFILLED_SYRINGE | INTRAMUSCULAR | Status: AC
  Filled 2021-06-23: qty 0.5

## 2021-06-23 MED ORDER — LIDOCAINE HCL (PF) 1 % IJ SOLN
INTRAMUSCULAR | Status: AC
  Filled 2021-06-23: qty 2

## 2021-06-23 NOTE — Discharge Instructions (Addendum)
Beginning tomorrow you can begin doing twice a day dressing changes. Clean the wound with warm soapy water or hydrogen peroxide twice daily.  You can then apply new Neosporin ointment to it.  Keep it clean and dry.  You can take Tylenol 500 mg 2 every 4 hours as needed for pain

## 2021-06-23 NOTE — ED Triage Notes (Signed)
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

## 2021-06-23 NOTE — ED Provider Notes (Addendum)
[facility]    CSN: [id number] Arrival date & time: 06/23/21  1525      History   Chief Complaint Chief Complaint  Patient presents with   Finger Injury    HPI Mark Lamb is a 26 y.o. male.   HPI For a laceration to his left index fingertip.  It was sustained at work approximately 1500 today.  No allergies.  No chronic illnesses.  Uncertain last tetanus  Past Medical History:  Diagnosis Date   Asthma     There are no problems to display for this patient.   Past Surgical History:  Procedure Laterality Date   PATELLAR TENDON REPAIR  05/13/2011   Procedure: PATELLA TENDON REPAIR;  Surgeon: Loanne Drilling;  Location: [facility];  Service: Orthopedics;  Laterality: Left;  LEFT TIBIAL TUBICAL FIXATION       Home Medications    Prior to Admission medications   Not on File    Family History History reviewed. No pertinent family history.  Social History Social History   Tobacco Use   Smoking status: Never   Smokeless tobacco: Never  Substance Use Topics   Alcohol use: No   Drug use: No     Allergies   Patient has no known allergies.   Review of Systems Review of Systems   Physical Exam Triage Vital Signs ED Triage Vitals  Enc Vitals Group     BP 06/23/21 1616 131/83     Pulse Rate 06/23/21 1616 76     Resp 06/23/21 1616 18     Temp 06/23/21 1616 98 F (36.7 C)     Temp src --      SpO2 06/23/21 1616 100 %     Weight --      Height --      Head Circumference --      Peak Flow --      Pain Score 06/23/21 1614 0     Pain Loc --      Pain Edu? --      Excl. in GC? --    No data found.  Updated Vital Signs BP 131/83    Pulse 76    Temp 98 F (36.7 C)    Resp 18    SpO2 100%   Visual Acuity Right Eye Distance:   Left Eye Distance:   Bilateral Distance:    Right Eye Near:   Left Eye Near:    Bilateral Near:     Physical Exam Vitals reviewed.  Constitutional:      General: He is not in acute distress.     Appearance: He is not toxic-appearing.  Skin:    Capillary Refill: Capillary refill takes less than 2 seconds.     Coloration: Skin is not jaundiced or pale.     Comments: Has a curvilinear laceration 1.5 cm in length on the left index fingerpad. The distal portion is more of a flap. Cut wanted to bleed persistently whenever pressure was released, but it did finally quit.   Neurological:     Mental Status: He is alert and oriented to person, place, and time.  Psychiatric:        Behavior: Behavior normal.     UC Treatments / Results  Labs (all labs ordered are listed, but only abnormal results are displayed) Labs Reviewed - No data to display  EKG   Radiology No results found.  Procedures Procedures (including critical care time)  Medications Ordered in UC Medications  Tdap (BOOSTRIX) injection 0.5 mL (has no administration in time range)    Initial Impression / Assessment and Plan / UC Course  I have reviewed the triage vital signs and the nursing notes.  Pertinent labs & imaging results that were available during my care of the patient were reviewed by me and considered in my medical decision making (see chart for details).     After verbal consent was given, 1% lidocaine plain was used for digital block after approximately 10 minutes anesthesia had not been obtained and more was placed.  Approximately 6 mL was used.  Anesthesia obtained was fair.  Under clean conditions 5-0 nylon was used to place 3 sutures across the laceration.  Good hemostasis was achieved.  No complications. 5 mL of blood was lost Final Clinical Impressions(s) / UC Diagnoses   Final diagnoses:  Laceration of right index finger without damage to nail, foreign body presence unspecified, initial encounter     Discharge Instructions      Beginning tomorrow you can begin doing twice a day dressing changes. Clean the wound with warm soapy water or hydrogen peroxide twice daily.  You can then apply  new Neosporin ointment to it.  Keep it clean and dry.  You can take Tylenol 500 mg 2 every 4 hours as needed for pain     ED Prescriptions   None    PDMP not reviewed this encounter.   Zenia Resides, MD 06/23/21 1728    Zenia Resides, MD 06/23/21 1729

## 2021-06-29 ENCOUNTER — Ambulatory Visit (HOSPITAL_COMMUNITY)
Admission: EM | Admit: 2021-06-29 | Discharge: 2021-06-29 | Disposition: A | Discharge status: Home / Self Care | Payer: Self-pay | Attending: Family Medicine | Admitting: Family Medicine

## 2021-06-29 ENCOUNTER — Encounter (HOSPITAL_COMMUNITY): Payer: Self-pay

## 2021-06-29 ENCOUNTER — Other Ambulatory Visit: Payer: Self-pay

## 2021-06-29 DIAGNOSIS — Z4802 Encounter for removal of sutures: Secondary | ICD-10-CM

## 2021-06-29 NOTE — Discharge Instructions (Addendum)
You were seen today for suture removal.  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  You may continue to wear things to protect your finger.  It will be painful and sensative for some time to come.

## 2021-06-29 NOTE — ED Provider Notes (Signed)
[facility]    CSN: [id number] Arrival date & time: 06/29/21  1246      History   Chief Complaint Chief Complaint  Patient presents with   Wound Check    HPI Mark Lamb is a 26 y.o. male.    Wound Check   Past Medical History:  Diagnosis Date   Asthma     There are no problems to display for this patient.   Past Surgical History:  Procedure Laterality Date   PATELLAR TENDON REPAIR  05/13/2011   Procedure: PATELLA TENDON REPAIR;  Surgeon: Loanne Drilling;  Location: [facility];  Service: Orthopedics;  Laterality: Left;  LEFT TIBIAL TUBICAL FIXATION       Home Medications    Prior to Admission medications   Not on File    Family History History reviewed. No pertinent family history.  Social History Social History   Tobacco Use   Smoking status: Never   Smokeless tobacco: Never  Substance Use Topics   Alcohol use: No   Drug use: No     Allergies   Patient has no known allergies.   Review of Systems Review of Systems  Constitutional: Negative.   HENT: Negative.    Respiratory: Negative.    Cardiovascular: Negative.   Gastrointestinal: Negative.   Skin:  Positive for wound.    Physical Exam Triage Vital Signs ED Triage Vitals  Enc Vitals Group     BP 06/29/21 1341 (!) 143/89     Pulse Rate 06/29/21 1341 75     Resp 06/29/21 1341 18     Temp 06/29/21 1341 98.7 F (37.1 C)     Temp Source 06/29/21 1341 Oral     SpO2 06/29/21 1341 97 %     Weight --      Height --      Head Circumference --      Peak Flow --      Pain Score 06/29/21 1342 5     Pain Loc --      Pain Edu? --      Excl. in GC? --    No data found.  Updated Vital Signs BP (!) 143/89 (BP Location: Left Arm)    Pulse 75    Temp 98.7 F (37.1 C) (Oral)    Resp 18    SpO2 97%   Visual Acuity Right Eye Distance:   Left Eye Distance:   Bilateral Distance:    Right Eye Near:   Left Eye Near:    Bilateral Near:     Physical Exam Constitutional:       Appearance: Normal appearance.  Skin:    Comments: There is a healing wound at the right index finger;  1 stitch still in place.  2 stiches are still present, but untied.  No bleeding or oozing noted;  slightly tender;   Neurological:     Mental Status: He is alert.    Two sutures that were untied were removed easily;  the 3rd was removed using scissors and tweezers;  patient tolerated well;  no bleeding/oozing noted;   UC Treatments / Results  Labs (all labs ordered are listed, but only abnormal results are displayed) Labs Reviewed - No data to display  EKG   Radiology No results found.  Procedures Procedures (including critical care time)  Medications Ordered in UC Medications - No data to display  Initial Impression / Assessment and Plan / UC Course  I have reviewed the triage vital signs  and the nursing notes.  Pertinent labs & imaging results that were available during my care of the patient were reviewed by me and considered in my medical decision making (see chart for details).  Patient seen for suture removal today.  Patient tolerated well.  Warnings/precautions given.    Final Clinical Impressions(s) / UC Diagnoses   Final diagnoses:  Visit for suture removal     Discharge Instructions      You were seen today for suture removal.  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  You may continue to wear things to protect your finger.  It will be painful and sensative for some time to come.     ED Prescriptions   None    PDMP not reviewed this encounter.   Jannifer Franklin, MD 06/29/21 1402

## 2021-06-29 NOTE — ED Triage Notes (Signed)
Pt states had sutures places  on 06/23/2021. States 2 if his sutures came out. No bleeding noted, wound healing normal.

## 2022-07-08 ENCOUNTER — Encounter (HOSPITAL_COMMUNITY): Payer: Self-pay

## 2022-07-08 ENCOUNTER — Ambulatory Visit (HOSPITAL_COMMUNITY)
Admission: EM | Admit: 2022-07-08 | Discharge: 2022-07-08 | Disposition: A | Discharge status: Home / Self Care | Payer: BLUE CROSS/BLUE SHIELD | Attending: Family Medicine | Admitting: Family Medicine

## 2022-07-08 DIAGNOSIS — J069 Acute upper respiratory infection, unspecified: Secondary | ICD-10-CM | POA: Diagnosis not present

## 2022-07-08 MED ORDER — PROMETHAZINE-DM 6.25-15 MG/5ML PO SYRP: 5.0000 mL | ORAL_SOLUTION | Freq: Four times a day (QID) | ORAL | 0 refills | Status: AC | PRN

## 2022-07-08 MED ORDER — IBUPROFEN 800 MG PO TABS: 800.0000 mg | ORAL_TABLET | Freq: Three times a day (TID) | ORAL | 0 refills | Status: DC

## 2022-07-08 NOTE — ED Provider Notes (Signed)
  [facility]   [id number] 07/08/22 Arrival Time: 7035  ASSESSMENT & PLAN:  1. Viral URI with cough    Discussed typical duration of likely viral illness. Work note provided. OTC symptom care as needed.  Discharge Medication List as of 07/08/2022  5:33 PM     START taking these medications   Details  ibuprofen (ADVIL) 800 MG tablet Take 1 tablet (800 mg total) by mouth 3 (three) times daily with meals., Starting Thu 07/08/2022, Normal    promethazine-dextromethorphan (PROMETHAZINE-DM) 6.25-15 MG/5ML syrup Take 5 mLs by mouth 4 (four) times daily as needed for cough., Starting Thu 07/08/2022, Normal         Follow-up Information     [facility].   Specialty: Urgent Care Why: If worsening or failing to improve as anticipated. Contact information: [place] [address] [phone]                Reviewed expectations re: course of current medical issues. Questions answered. Outlined signs and symptoms indicating need for more acute intervention. Understanding verbalized. After Visit Summary given.   SUBJECTIVE: History from: Patient. Mark Lamb is a 27 y.o. male. Reports: nasal congestion, ST, cough, x 2 days; abrupt onset. Denies: fever and difficulty breathing. Normal PO intake without n/v/d.  OBJECTIVE:  Vitals:   07/08/22 1721 07/08/22 1722  BP:  138/81  Pulse:  64  Resp:  16  Temp:  98.5 F (36.9 C)  TempSrc:  Oral  SpO2:  99%  Weight: 59.4 kg   Height: 6\' 1"  (1.854 m)     General appearance: alert; no distress Eyes: PERRLA; EOMI; conjunctiva normal HENT: [place]; AT; with nasal congestion Neck: supple  Lungs: speaks full sentences without difficulty; unlabored; ctab Extremities: no edema Skin: warm and dry Neurologic: normal gait Psychological: alert and cooperative; normal mood and affect   No Known Allergies  Past Medical History:  Diagnosis Date   Asthma     Social History   Socioeconomic History   Marital status: Single    Spouse name: Not on file   Number of children: Not on file   Years of education: Not on file   Highest education level: Not on file  Occupational History   Not on file  Tobacco Use   Smoking status: Never   Smokeless tobacco: Never  Vaping Use   Vaping Use: Never used  Substance and Sexual Activity   Alcohol use: No   Drug use: Yes    Types: Marijuana   Sexual activity: Yes  Other Topics Concern   Not on file  Social History Narrative   Not on file   Social Determinants of Health   Financial Resource Strain: Not on file  Food Insecurity: Not on file  Transportation Needs: Not on file  Physical Activity: Not on file  Stress: Not on file  Social Connections: Not on file  Intimate Partner Violence: Not on file   History reviewed. No pertinent family history. Past Surgical History:  Procedure Laterality Date   PATELLAR TENDON REPAIR  05/13/2011   Procedure: PATELLA TENDON REPAIR;  Surgeon: Gearlean Alf;  Location: [facility];  Service: Orthopedics;  Laterality: Left;  LEFT TIBIAL TUBICAL FIXATION     Vanessa Kick, MD 07/08/22 [phone]

## 2022-07-08 NOTE — ED Triage Notes (Signed)
Chief Complaint: nasal congestion, sore throat for the first 2 days, productive cough the first 2 days.Patient had chills      Onset: Monday  Prescriptions or OTC medications tried: Yes- Vics vapor rub, tylenol pm, Thera flu     with mild relief  Sick exposure: No  New foods, medications, or products: No  Recent Travel: No

## 2022-07-08 NOTE — Discharge Instructions (Signed)
Caring for yourself: Get plenty of rest. Drink plenty of fluids, enough so that your urine is light yellow or clear like water. If you have kidney, heart, or liver disease and have to limit fluids, talk with your doctor before you increase the amount of fluids you drink. Take an over-the-counter pain medicine if needed, such as acetaminophen (Tylenol), ibuprofen (Advil, Motrin), or naproxen (Aleve), to relieve fever, headache, and muscle aches. Read and follow all instructions on the label. No one younger than 20 should take aspirin. It has been linked to Reye syndrome, a serious illness. Before you use over the counter cough and cold medicines, check the label. These medicines may not be safe for children younger than age 31 or for people with certain health problems. If the skin around your nose and lips becomes sore, put some petroleum jelly on the area.  Avoid spreading a respiratory illness: Wash your hands regularly, and keep your hands away from your face.  Stay home from school, work, and other public places until you are feeling better and your fever has been gone for at least 24 hours. The fever needs to have gone away on its own without the help of medicine.

## 2022-12-14 ENCOUNTER — Encounter (HOSPITAL_COMMUNITY): Payer: Self-pay

## 2022-12-14 ENCOUNTER — Emergency Department (HOSPITAL_COMMUNITY)
Admission: EM | Admit: 2022-12-14 | Discharge: 2022-12-14 | Disposition: A | Discharge status: Home / Self Care | Payer: BLUE CROSS/BLUE SHIELD | Attending: Emergency Medicine | Admitting: Emergency Medicine

## 2022-12-14 DIAGNOSIS — L03115 Cellulitis of right lower limb: Secondary | ICD-10-CM | POA: Diagnosis not present

## 2022-12-14 DIAGNOSIS — L03116 Cellulitis of left lower limb: Secondary | ICD-10-CM | POA: Diagnosis not present

## 2022-12-14 DIAGNOSIS — L03119 Cellulitis of unspecified part of limb: Secondary | ICD-10-CM

## 2022-12-14 DIAGNOSIS — M7989 Other specified soft tissue disorders: Secondary | ICD-10-CM | POA: Diagnosis present

## 2022-12-14 MED ORDER — CEPHALEXIN 500 MG PO CAPS: 500.0000 mg | ORAL_CAPSULE | Freq: Four times a day (QID) | ORAL | 0 refills | Status: AC

## 2022-12-14 MED ORDER — NAPROXEN 375 MG PO TABS: 375.0000 mg | ORAL_TABLET | Freq: Two times a day (BID) | ORAL | 0 refills | Status: AC

## 2022-12-14 NOTE — ED Provider Notes (Signed)
[place] EMERGENCY DEPARTMENT AT [facility] Provider Note   CSN: [id number] Arrival date & time: 12/14/22  1313     History  Chief Complaint  Patient presents with   Leg Swelling    Mark Lamb is a 26 y.o. male.  27 y.o male with no PMH presents to the ED with a chief complaint of bilateral ankle pain and swelling which began today.  Patient reports he was at work when suddenly 30 minutes prior to his arrival in the ED he noted to bilateral legs to be swollen and red.  He reports his coworker who was in his 26s also had the same thing and then ended up losing his legs.  He currently works at [employer].  There has not been any injury, no falls, no fevers.  He has not taken any medication for improvement in his symptoms. No other complaints reported.   The history is provided by the patient.       Home Medications Prior to Admission medications   Medication Sig Start Date End Date Taking? Authorizing Provider  cephALEXin (KEFLEX) 500 MG capsule Take 1 capsule (500 mg total) by mouth 4 (four) times daily for 7 days. 12/14/22 12/21/22 Yes Jaterrius Ricketson, PA-C  naproxen (NAPROSYN) 375 MG tablet Take 1 tablet (375 mg total) by mouth 2 (two) times daily for 7 days. 12/14/22 12/21/22 Yes Mark Lamb, Mark Douglas, PA-C  promethazine-dextromethorphan (PROMETHAZINE-DM) 6.25-15 MG/5ML syrup Take 5 mLs by mouth 4 (four) times daily as needed for cough. 07/08/22   Mardella Layman, MD      Allergies    Patient has no known allergies.    Review of Systems   Review of Systems  Constitutional:  Negative for fever.  Skin:  Positive for rash.    Physical Exam Updated Vital Signs BP 128/74 (BP Location: Right Arm)   Pulse 78   Temp 98.5 F (36.9 C) (Oral)   Resp 16   SpO2 99%  Physical Exam Vitals and nursing note reviewed.  Constitutional:      Appearance: Normal appearance.  HENT:     Head: Normocephalic and atraumatic.  Eyes:     Pupils: Pupils are equal, round, and  reactive to light.  Cardiovascular:     Rate and Rhythm: Normal rate.     Comments: BL ankle erythema and swelling. NO calf tenderness BL.  Pulmonary:     Effort: Pulmonary effort is normal.  Abdominal:     General: Abdomen is flat.  Musculoskeletal:        General: No swelling, tenderness, deformity or signs of injury.     Cervical back: Normal range of motion and neck supple.  Skin:    General: Skin is warm.     Findings: Erythema present.  Neurological:     Mental Status: He is alert and oriented to person, place, and time.     ED Results / Procedures / Treatments   Labs (all labs ordered are listed, but only abnormal results are displayed) Labs Reviewed - No data to display  EKG None  Radiology No results found.  Procedures Procedures    Medications Ordered in ED Medications - No data to display  ED Course/ Medical Decision Making/ A&P                             Medical Decision Making   Patient presents to the ED with bilateral leg swelling that occurred 30  minutes prior to arrival in the emergency department.  Concern is one of his coworkers had a similar episode when he had a removal of one of his legs.  Although this coworkers and his 24s.  He has not tried taking any medication for improvement in symptoms, there has been no falls, no fevers, no limited range of motion of bilateral lower extremities.  Bilateral lower ankles do seem somewhat swollen, he had a prior fixation to his left calcaneus.  There is some erythema present some concern for cellulitis.  He is afebrile today, he is nontoxic-appearing, was sleeping in stretcher when evaluated by me. He is is no distress, no concern for septic joint.  No trauma therefore unlikely fracture versus dislocation.  We discussed with erythema and skin changes likely cellulitis, will try a short course of antibiotics along with anti-inflammatories to help with swelling.  Patient without any prior history of CHF.  Return  precautions discussed at length, hemodynamically stable for discharge.    Portions of this note were generated with Scientist, clinical (histocompatibility and immunogenetics). Dictation errors may occur despite best attempts at proofreading.   Final Clinical Impression(s) / ED Diagnoses Final diagnoses:  Cellulitis of lower extremity, unspecified laterality    Rx / DC Orders ED Discharge Orders          Ordered    naproxen (NAPROSYN) 375 MG tablet  2 times daily        12/14/22 1442    cephALEXin (KEFLEX) 500 MG capsule  4 times daily        12/14/22 1443              Claude Manges, PA-C 12/14/22 1447    Tegeler, Canary Brim, MD 12/14/22 [phone]

## 2022-12-14 NOTE — ED Triage Notes (Signed)
Pt arrived via POV, c/o bilateral ankle swelling and painful to palpation.

## 2022-12-14 NOTE — Discharge Instructions (Signed)
I have prescribed a short course of antibiotics in order to help treat your leg infection.  Please take 1 tablet 4 times a day for the next 7 days.  In addition I have also prescribed some anti-inflammatories to help with the swelling, please take 1 tablet twice a day with food.  You may also elevate your legs.  If you experience any worsening symptoms please return to emergency department.
